# Patient Record
Sex: Female | Born: 2005 | Race: White | Hispanic: Yes | Marital: Single | Smoking: Never smoker
Health system: Southern US, Community
[De-identification: ages and names within clinical notes are randomized; demographics above are authoritative.]

---
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
## 2023-02-14 ENCOUNTER — Telehealth: Payer: Self-pay | Admitting: *Deleted

## 2023-02-14 NOTE — Telephone Encounter (Signed)
PC to patient regarding scheduling her brain MRI.  Patient states she has MRI already scheduled on 02/28/23 at [facility].  She has appointment here 03/05/23, informed patient we will request her MRI images from [facility] before her appointment with Dr Barbaraann Cao.  She verbalizes understanding.

## 2023-03-04 ENCOUNTER — Inpatient Hospital Stay
Admission: RE | Admit: 2023-03-04 | Discharge: 2023-03-04 | Disposition: A | Discharge status: Home / Self Care | Payer: Self-pay | Source: Ambulatory Visit | Attending: Internal Medicine

## 2023-03-04 ENCOUNTER — Other Ambulatory Visit: Payer: Self-pay | Admitting: *Deleted

## 2023-03-04 DIAGNOSIS — R930 Abnormal findings on diagnostic imaging of skull and head, not elsewhere classified: Secondary | ICD-10-CM

## 2023-03-04 NOTE — Progress Notes (Signed)
Requested MRI Brain completed on 02/28/2023 at [facility].

## 2023-03-05 ENCOUNTER — Telehealth: Payer: Self-pay | Admitting: *Deleted

## 2023-03-05 ENCOUNTER — Inpatient Hospital Stay: Payer: Medicaid Other | Admitting: Internal Medicine

## 2023-03-05 NOTE — Progress Notes (Deleted)
 [facility] [employer] contacted, no images available from patient's recent brain MRI.  Was informed MRI wasn't scheduled.  PC to patient, she states MRI is scheduled for 04/10/23, informed patient we will request images from the March MRI & will schedule an appointment afterwards so Dr Buckley can discuss results.  Today's appointment canceled, patient verbalizes understanding.

## 2023-03-05 NOTE — Telephone Encounter (Signed)
 PC to [facility] regarding images from MRI on 02/28/23 at [facility].  Was informed MRI was never scheduled.  PC to patient, she states MRI is scheduled at [facility] on 04/10/23, informed patient we will request these images & a F/U appointment with Dr Buckley will be scheduled the next week to review results. Today's appointment will be canceled. Patient verbalizes understanding, scheduling message sent.

## 2023-04-11 ENCOUNTER — Telehealth: Payer: Self-pay | Admitting: *Deleted

## 2023-04-11 NOTE — Telephone Encounter (Signed)
 PC to patient to determine if she had MRI and [facility] yesterday, patient states her appointments were changed & now her MRI is scheduled for 05/17/23 at [facility].  Informed patient her appointment here on 04/15/23 will be canceled & we will reschedule after MRI in April.  She verbalizes understanding.

## 2023-04-15 ENCOUNTER — Inpatient Hospital Stay: Payer: Medicaid Other | Admitting: Internal Medicine

## 2023-05-20 ENCOUNTER — Inpatient Hospital Stay
Admission: RE | Admit: 2023-05-20 | Discharge: 2023-05-20 | Disposition: A | Discharge status: Home / Self Care | Payer: Self-pay | Source: Ambulatory Visit | Attending: Internal Medicine | Admitting: Internal Medicine

## 2023-05-20 ENCOUNTER — Other Ambulatory Visit: Payer: Self-pay | Admitting: *Deleted

## 2023-05-20 ENCOUNTER — Encounter: Payer: Self-pay | Admitting: *Deleted

## 2023-05-20 DIAGNOSIS — R930 Abnormal findings on diagnostic imaging of skull and head, not elsewhere classified: Secondary | ICD-10-CM

## 2023-05-20 NOTE — Progress Notes (Signed)
 Requested MRI Brain completed in March or April from [facility].  Chart Review/Routing History shows details of fax sent.  Sent email to [employer] to link to outside films.

## 2023-05-23 ENCOUNTER — Inpatient Hospital Stay: Attending: Internal Medicine | Admitting: Internal Medicine

## 2023-05-23 ENCOUNTER — Telehealth: Payer: Self-pay | Admitting: *Deleted

## 2023-05-23 VITALS — BP 106/62 | HR 79 | Temp 98.3°F | Resp 15 | Wt 135.1 lb

## 2023-05-23 DIAGNOSIS — R531 Weakness: Secondary | ICD-10-CM | POA: Insufficient documentation

## 2023-05-23 DIAGNOSIS — R930 Abnormal findings on diagnostic imaging of skull and head, not elsewhere classified: Secondary | ICD-10-CM | POA: Diagnosis present

## 2023-05-23 NOTE — Progress Notes (Signed)
 [facility] at [facility] [address]  [place], [place] [address] [phone]   Interval Evaluation  Date of Service: 05/23/23 Patient Name: Kiara Matthews Patient MRN: [id number] Patient DOB: 02/08/2005 Provider: Mamie Searles, MD  Identifying Statement:  Kiara Matthews is a 18 y.o. female with  right lentiform   lesion    Oncologic History: Oncology History   No history exists.    Biomarkers:  MGMT Unknown.  IDH 1/2 Unknown.  EGFR Unknown  TERT Unknown   Interval History: Kiara Matthews presents today for follow up after recent MRI brain, this time done at [facility].  She describes resolution of prior left sided weakness.  She now feels back at her prior baseline.  No other new or progressive complaints.    H+P (08/21/22) Patient presented to medical attention on 08/03/22 with rapidly progressive weakness of left leg and new headache.  She began to notice the leg "dragging a little" during mid-day, and by the evening "couldn't lift leg at all".  The left side of face felt "funny" but it wasn't clearly drooping.  The headache was holocranial, she has not had many headaches in the past.  She was admitted with abnormal brain MRI findings, completed 5 days of high dose solumedrol therapy, which did not really improve her symptoms.  Since discharge, she is still quite weak, though she feels it has improved somewhat.  No further headaches.  She has seen general neurology and has upcoming PT sessions.  Medications: Current Outpatient Medications on File Prior to Visit  Medication Sig Dispense Refill   chlorhexidine (PERIDEX) 0.12 % solution Use as directed 15 mLs in the mouth or throat 2 (two) times daily.     cyanocobalamin  1000 MCG tablet Take 1 tablet (1,000 mcg total) by mouth daily. 30 tablet 0   thiamine  (VITAMIN B1) 100 MG tablet Take 1 tablet (100 mg total) by mouth daily. 30 tablet 0   Vitamin D , Ergocalciferol , (DRISDOL ) 1.25 MG (50000 UNIT)  CAPS capsule Take 1 capsule (50,000 Units total) by mouth every Thursday. 5 capsule 0   No current facility-administered medications on file prior to visit.    Allergies: No Known Allergies Past Medical History: No past medical history on file. Past Surgical History: No past surgical history on file. Social History:  Social History   Socioeconomic History   Marital status: Single    Spouse name: Not on file   Number of children: 0   Years of education: Not on file   Highest education level: High school graduate  Occupational History   Not on file  Tobacco Use   Smoking status: Never   Smokeless tobacco: Never  Vaping Use   Vaping status: Never Used  Substance and Sexual Activity   Alcohol use: Never   Drug use: Never   Sexual activity: Never  Other Topics Concern   Not on file  Social History Narrative   Not on file   Social Drivers of Health   Financial Resource Strain: Not on File (05/18/2021)   Received from [employer], [employer]    Financial Resource Strain: 0  Food Insecurity: Not at Risk (04/12/2023)   Received from [facility] Insecurity    Food: 1  Transportation Needs: Not at Risk (04/12/2023)   Received from [employer] Needs    Transportation: 1  Physical Activity: Not on File (05/18/2021)   Received from [place], [place]   Physical Activity  Physical Activity: 0  Stress: Not on File (05/18/2021)   Received from [facility], [place]   Stress    Stress: 0  Social Connections: Not on File (10/20/2022)   Received from [facility]   Social Connections    Connectedness: 0  Intimate Partner Violence: Not At Risk (08/08/2022)   Humiliation, Afraid, Rape, and Kick questionnaire    Fear of Current or Ex-Partner: No    Emotionally Abused: No    Physically Abused: No    Sexually Abused: No   Family History:  Family History  Problem Relation Age of Onset   Stroke Maternal Grandfather     Review of Systems: Constitutional: Doesn't report  fevers, chills or abnormal weight loss Eyes: Doesn't report blurriness of vision Ears, nose, mouth, throat, and face: Doesn't report sore throat Respiratory: Doesn't report cough, dyspnea or wheezes Cardiovascular: Doesn't report palpitation, chest discomfort  Gastrointestinal:  Doesn't report nausea, constipation, diarrhea GU: Doesn't report incontinence Skin: Doesn't report skin rashes Neurological: Per HPI Musculoskeletal: Doesn't report joint pain Behavioral/Psych: Doesn't report anxiety  Physical Exam: Vitals:   05/23/23 1135  BP: 106/62  Pulse: 79  Resp: 15  Temp: 98.3 F (36.8 C)  SpO2: 100%    KPS: 70. General: Alert, cooperative, pleasant, in no acute distress Head: Normal EENT: No conjunctival injection or scleral icterus.  Lungs: Resp effort normal Cardiac: Regular rate Abdomen: Non-distended abdomen Skin: No rashes cyanosis or petechiae. Extremities: No clubbing or edema  Neurologic Exam: Mental Status: Awake, alert, attentive to examiner. Oriented to self and environment. Language is fluent with intact comprehension.  Cranial Nerves: Visual acuity is grossly normal. Visual fields are full. Extra-ocular movements intact. No ptosis. Face is symmetric Motor: Tone and bulk are normal. Power is 4+/5 in left leg, proximal and distal, 5/5 otherwise. Reflexes are symmetric, no pathologic reflexes present.  Sensory: Intact to light touch Gait: Hemiparetic  Labs: I have reviewed the data as listed    Component Value Date/Time   NA 136 08/11/2022 1040   K 3.7 08/11/2022 1040   CL 103 08/11/2022 1040   CO2 25 08/11/2022 1040   GLUCOSE 88 08/11/2022 1040   BUN 18 08/11/2022 1040   CREATININE 0.54 08/11/2022 1040   CALCIUM 8.6 (L) 08/11/2022 1040   PROT 6.2 (L) 08/11/2022 1040   ALBUMIN 3.4 (L) 08/11/2022 1040   ALBUMIN 4.2 08/04/2022 0200   AST 19 08/11/2022 1040   ALT 13 08/11/2022 1040   ALKPHOS 48 08/11/2022 1040   BILITOT 0.5 08/11/2022 1040   GFRNONAA  >60 08/11/2022 1040   GFRAA NOT CALCULATED 07/26/2019 1404   Lab Results  Component Value Date   WBC 9.1 08/11/2022   NEUTROABS 5.7 08/11/2022   HGB 11.7 (L) 08/11/2022   HCT 35.9 (L) 08/11/2022   MCV 88.4 08/11/2022   PLT 232 08/11/2022    Imaging:  FINDINGS:  MRI brain findings-  Unchanged ill-defined T2/FLAIR hyperintense lesion in the posterior and superior right putamen, approximately measures 1.5 cm, previously measuring 1.6 cm when measured similarly (13:14). A small focus of T2/FLAIR hyperintensity is seen in the inferiomedial globus pallidus, which is unchanged from prior (13:14). No associated enhancement. No new lesions.  Ventricles are normal in size. There is no midline shift. No extra-axial fluid collection. No evidence of intracranial hemorrhage. No diffusion weighted signal abnormality to suggest acute infarct.  Right frontal and right cerebellar DVA's.  MRA head findings-  INTRACRANIAL CAROTID ARTERIES No significant stenosis. No occlusion. No aneurysm.  ANTERIOR CEREBRAL ARTERIES  No significant stenosis. No occlusion. No aneurysm.  MIDDLE CEREBRAL ARTERIES No significant stenosis. No occlusion. No aneurysm.  POSTERIOR CEREBRAL ARTERIES No significant stenosis. No occlusion. No aneurysm.  VERTEBRAL and BASILAR ARTERIES No significant stenosis. No occlusion. No aneurysm.  IMPRESSION: -Stable appearance of right basal ganglia T2/FLAIR hyperintense lesions, which are unchanged in size from prior. No new lesions. No associated enhancement.  - Unremarkable MRI of the brain.  - Right frontal and right cerebellar DVA's, unchanged. Exam End: 05/17/23 16:58    Pathology: n/a  Assessment/Plan Abnormal MRI of head  Left-sided weakness  Kiara Matthews is clinically stable today, with improved motor deficits on the left.    Brain MRI continues to demonstrate stable findings within right lentiform nucleus.  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  It is likely that at some point this will need to be biopsied; will con't to defer given location and motor deficits.  Because of possibility of inflammation/dysplasia as etiology, she will also continue to follow with general neurology.    We ask that Kiara Matthews return to clinic in 4 months following next brain MRI, or sooner as needed.    All questions were answered. The patient knows to call the clinic with any problems, questions or concerns. No barriers to learning were detected.  The total time spent in the encounter was 40 minutes and more than 50% was on counseling and review of test results   Mamie Searles, MD Medical Director of Neuro-Oncology [facility] at [facility] 05/23/23 11:37 AM

## 2023-05-23 NOTE — Telephone Encounter (Signed)
 PC to [facility], requested that MRI brain done on 05/17/23 be pushed to [facility], patient demographic sheet faxed to [phone], fax confirmation received.  Email to [employer] requesting images be attached to outside films order.

## 2023-05-27 ENCOUNTER — Encounter: Payer: Self-pay | Admitting: *Deleted

## 2023-05-27 NOTE — Progress Notes (Signed)
 Spoke with [facility] and requested that MRI and MRA images be sent over and linked to the outside films order.  Sent additional request to [facility].

## 2023-06-14 ENCOUNTER — Telehealth: Payer: Self-pay | Admitting: Internal Medicine

## 2023-06-14 NOTE — Telephone Encounter (Signed)
 Aaron Aas

## 2023-09-18 ENCOUNTER — Telehealth: Payer: Self-pay | Admitting: *Deleted

## 2023-09-18 NOTE — Telephone Encounter (Signed)
 PC to patient, spoke with [name], she states the patient is in school - informed Kiara Matthews insurance is still pending for the patient's MRI which is scheduled for tomorrow.  This will be canceled, then rescheduled once we have insurance approval.  She has a F/U appointment with Dr Buckley on 10/15/23, we will leave this as is & hopefully will have insurance approval for the MRI before this.  Kiara Matthews verbalizes understanding & states she will inform the patient.

## 2023-09-19 ENCOUNTER — Ambulatory Visit (HOSPITAL_COMMUNITY): Admission: RE | Admit: 2023-09-19 | Source: Ambulatory Visit

## 2023-09-21 ENCOUNTER — Ambulatory Visit (HOSPITAL_COMMUNITY)
Admission: RE | Admit: 2023-09-21 | Discharge: 2023-09-21 | Disposition: A | Discharge status: Home / Self Care | Source: Ambulatory Visit | Attending: Internal Medicine | Admitting: Internal Medicine

## 2023-09-21 ENCOUNTER — Other Ambulatory Visit: Payer: Self-pay | Admitting: Internal Medicine

## 2023-09-21 DIAGNOSIS — R930 Abnormal findings on diagnostic imaging of skull and head, not elsewhere classified: Secondary | ICD-10-CM

## 2023-09-21 DIAGNOSIS — R531 Weakness: Secondary | ICD-10-CM

## 2023-10-15 ENCOUNTER — Ambulatory Visit: Admitting: Internal Medicine

## 2023-10-17 ENCOUNTER — Inpatient Hospital Stay: Attending: Internal Medicine | Admitting: Internal Medicine

## 2023-10-17 VITALS — BP 115/74 | HR 80 | Temp 97.9°F

## 2023-10-17 DIAGNOSIS — R930 Abnormal findings on diagnostic imaging of skull and head, not elsewhere classified: Secondary | ICD-10-CM | POA: Insufficient documentation

## 2023-10-17 DIAGNOSIS — G939 Disorder of brain, unspecified: Secondary | ICD-10-CM | POA: Diagnosis present

## 2023-10-17 NOTE — Progress Notes (Signed)
 [facility] [address]  [place], [place] [address] [phone]   Interval Evaluation  Date of Service: 10/17/23 Patient Name: Kiara Matthews Patient MRN: [id number] Patient DOB: 2005-08-25 Provider: Arthea MARLA Manns, MD  Identifying Statement:  Kiara Matthews is a 18 y.o. female with right lentiform lesion   Oncologic History: Oncology History   No history exists.    Biomarkers:  MGMT Unknown.  IDH 1/2 Unknown.  EGFR Unknown  TERT Unknown   Interval History: Kiara Matthews presents today for follow up after recent MRI brain.  She describes no recurrence of left sided weakness.  She now feels back at her prior baseline.  No other new or progressive complaints.    H+P (08/21/22) Patient presented to medical attention on 08/03/22 with rapidly progressive weakness of left leg and new headache.  She began to notice the leg dragging a little during mid-day, and by the evening couldn't lift leg at all.  The left side of face felt funny but it wasn't clearly drooping.  The headache was holocranial, she has not had many headaches in the past.  She was admitted with abnormal brain MRI findings, completed 5 days of high dose solumedrol therapy, which did not really improve her symptoms.  Since discharge, she is still quite weak, though she feels it has improved somewhat.  No further headaches.  She has seen general neurology and has upcoming PT sessions.  Medications: Current Outpatient Medications on File Prior to Visit  Medication Sig Dispense Refill   ferrous sulfate 324 (65 Fe) MG TBEC Take 324 mg by mouth.     Vitamin D , Ergocalciferol , (DRISDOL ) 1.25 MG (50000 UNIT) CAPS capsule Take 1 capsule (50,000 Units total) by mouth every Thursday. 5 capsule 0   No current facility-administered medications on file prior to visit.    Allergies: No Known Allergies Past Medical History: No past medical history on file. Past Surgical  History: No past surgical history on file. Social History:  Social History   Socioeconomic History   Marital status: Single    Spouse name: Not on file   Number of children: 0   Years of education: Not on file   Highest education level: High school graduate  Occupational History   Not on file  Tobacco Use   Smoking status: Never   Smokeless tobacco: Never  Vaping Use   Vaping status: Never Used  Substance and Sexual Activity   Alcohol use: Never   Drug use: Never   Sexual activity: Never  Other Topics Concern   Not on file  Social History Narrative   Not on file   Social Drivers of Health   Financial Resource Strain: Not on File (05/18/2021)   Received from [employer]    Financial Resource Strain: 0  Food Insecurity: Not at Risk (04/12/2023)   Received from [facility] Insecurity    Within the past 12 months, the food you bought just didn't last and you didn't have enough money to get more.: 1  Transportation Needs: Not at Risk (04/12/2023)   Received from [employer] Needs    In the past 12 months, has lack of transportation kept you from medical appointments, meetings, work or from getting things needed for daily living? (Check all that apply): 1  Physical Activity: Not on File (05/18/2021)   Received from [facility]   Physical Activity    Physical Activity: 0  Stress: Not  on File (05/18/2021)   Received from [facility]   Stress    Stress: 0  Social Connections: Not on File (10/20/2022)   Received from [facility]   Social Connections    Connectedness: 0  Intimate Partner Violence: Not At Risk (08/08/2022)   Humiliation, Afraid, Rape, and Kick questionnaire    Fear of Current or Ex-Partner: No    Emotionally Abused: No    Physically Abused: No    Sexually Abused: No   Family History:  Family History  Problem Relation Age of Onset   Stroke Maternal Grandfather     Review of Systems: Constitutional: Doesn't report fevers, chills or  abnormal weight loss Eyes: Doesn't report blurriness of vision Ears, nose, mouth, throat, and face: Doesn't report sore throat Respiratory: Doesn't report cough, dyspnea or wheezes Cardiovascular: Doesn't report palpitation, chest discomfort  Gastrointestinal:  Doesn't report nausea, constipation, diarrhea GU: Doesn't report incontinence Skin: Doesn't report skin rashes Neurological: Per HPI Musculoskeletal: Doesn't report joint pain Behavioral/Psych: Doesn't report anxiety  Physical Exam: Vitals:   10/17/23 1043  BP: 115/74  Pulse: 80  Temp: 97.9 F (36.6 C)    KPS: 90. General: Alert, cooperative, pleasant, in no acute distress Head: Normal EENT: No conjunctival injection or scleral icterus.  Lungs: Resp effort normal Cardiac: Regular rate Abdomen: Non-distended abdomen Skin: No rashes cyanosis or petechiae. Extremities: No clubbing or edema  Neurologic Exam: Mental Status: Awake, alert, attentive to examiner. Oriented to self and environment. Language is fluent with intact comprehension.  Cranial Nerves: Visual acuity is grossly normal. Visual fields are full. Extra-ocular movements intact. No ptosis. Face is symmetric Motor: Tone and bulk are normal. Power is 5-5 in left leg, proximal and distal, 5/5 otherwise. Reflexes are symmetric, no pathologic reflexes present.  Sensory: Intact to light touch Gait: Independent  Labs: I have reviewed the data as listed    Component Value Date/Time   NA 136 08/11/2022 1040   K 3.7 08/11/2022 1040   CL 103 08/11/2022 1040   CO2 25 08/11/2022 1040   GLUCOSE 88 08/11/2022 1040   BUN 18 08/11/2022 1040   CREATININE 0.54 08/11/2022 1040   CALCIUM 8.6 (L) 08/11/2022 1040   PROT 6.2 (L) 08/11/2022 1040   ALBUMIN 3.4 (L) 08/11/2022 1040   ALBUMIN 4.2 08/04/2022 0200   AST 19 08/11/2022 1040   ALT 13 08/11/2022 1040   ALKPHOS 48 08/11/2022 1040   BILITOT 0.5 08/11/2022 1040   GFRNONAA >60 08/11/2022 1040   GFRAA NOT CALCULATED  07/26/2019 1404   Lab Results  Component Value Date   WBC 9.1 08/11/2022   NEUTROABS 5.7 08/11/2022   HGB 11.7 (L) 08/11/2022   HCT 35.9 (L) 08/11/2022   MCV 88.4 08/11/2022   PLT 232 08/11/2022    Imaging:  [facility] Clinician Interpretation: I have personally reviewed the CNS images as listed.  My interpretation, in the context of the patient's clinical presentation, is stable disease  MR BRAIN WO CONTRAST Result Date: 09/21/2023 CLINICAL DATA:  18 year old female with a history of nonenhancing right lentiform T2/FLAIR hyperintense lesion. The patient declined IV contrast. Noncontrast MRI only was performed. EXAM: MRI HEAD WITHOUT CONTRAST TECHNIQUE: Multiplanar, multiecho pulse sequences of the brain and surrounding structures were obtained without intravenous contrast. COMPARISON:  Brain MRI 10/04/2022 and earlier. More recent outside MRI 05/17/2023. FINDINGS: Brain: Chronic mildly patchy and indistinct asymmetric right lentiform T2 and FLAIR hyperintensity (series 9, image 28). Questionable mild lentiform enlargement on FLAIR is less apparent on T1 (series 12,  image 90). Lesion does not appear significantly changed since the oldest available MRI 08/04/2022, and is about 2 cm long axis (series 15, image 19). Diffusion is facilitated as before. No regional mass effect. No susceptibility there on SWI. No superimposed restricted diffusion to suggest acute infarction. No midline shift, mass effect, ventriculomegaly, extra-axial collection or acute intracranial hemorrhage. Cervicomedullary junction and pituitary are within normal limits. Incidental right cerebellar developmental venous anomaly (normal variant series 10, image 19)., and a smaller DVA of the anterior right frontal lobe better demonstrated with contrast (series 12, image 91 today). No new signal abnormality identified. Vascular: Major intracranial vascular flow voids are stable, preserved. Skull and upper cervical spine: Negative. Visualized  bone marrow signal is within normal limits. Sinuses/Orbits: Stable and negative. Other: Negative visible scalp and face. IMPRESSION: 1. Noncontrast study today. 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 2. No new intracranial abnormality. Electronically Signed   By: VEAR Hurst M.D.   On: 09/21/2023 10:30    Pathology: n/a  Assessment/Plan Abnormal MRI of head  Marene Mcadams is clinically stable today, with stable or resolved motor deficits on the left.    Brain MRI continues to demonstrate stable findings within right lentiform nucleus.  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  It is likely that at some point this will need to be biopsied; will con't to defer given location and motor deficits.  Because of possibility of inflammation/dysplasia as etiology, she will also continue to follow with general neurology.    We ask that Blue Matlack return to clinic in 6 months following next brain MRI, or sooner as needed.    All questions were answered. The patient knows to call the clinic with any problems, questions or concerns. No barriers to learning were detected.  The total time spent in the encounter was 40 minutes and more than 50% was on counseling and review of test results   Arthea MARLA Manns, MD Medical Director of Neuro-Oncology [facility] at [facility] 10/17/23 10:43 AM

## 2024-04-09 ENCOUNTER — Inpatient Hospital Stay: Admitting: Internal Medicine
